# Patient Record
Sex: Male | Born: 1975 | Race: White | Hispanic: No | Marital: Married | Smoking: Former smoker
Health system: Southern US, Community
[De-identification: ages and names within clinical notes are randomized; demographics above are authoritative.]

---

## 2015-01-25 ENCOUNTER — Emergency Department (HOSPITAL_BASED_OUTPATIENT_CLINIC_OR_DEPARTMENT_OTHER)
Admission: EM | Admit: 2015-01-25 | Discharge: 2015-01-26 | Disposition: A | Discharge status: Home / Self Care | Payer: 59 | Attending: Emergency Medicine | Admitting: Emergency Medicine

## 2015-01-25 ENCOUNTER — Emergency Department (HOSPITAL_BASED_OUTPATIENT_CLINIC_OR_DEPARTMENT_OTHER): Payer: 59

## 2015-01-25 ENCOUNTER — Encounter (HOSPITAL_BASED_OUTPATIENT_CLINIC_OR_DEPARTMENT_OTHER): Payer: Self-pay | Admitting: Emergency Medicine

## 2015-01-25 DIAGNOSIS — R002 Palpitations: Secondary | ICD-10-CM | POA: Insufficient documentation

## 2015-01-25 DIAGNOSIS — D649 Anemia, unspecified: Secondary | ICD-10-CM | POA: Diagnosis not present

## 2015-01-25 DIAGNOSIS — R0602 Shortness of breath: Secondary | ICD-10-CM

## 2015-01-25 DIAGNOSIS — Z87891 Personal history of nicotine dependence: Secondary | ICD-10-CM | POA: Insufficient documentation

## 2015-01-25 DIAGNOSIS — R000 Tachycardia, unspecified: Secondary | ICD-10-CM | POA: Diagnosis not present

## 2015-01-25 LAB — CBC WITH DIFFERENTIAL/PLATELET
BASOS PCT: 1 % (ref 0–1)
Basophils Absolute: 0.1 10*3/uL (ref 0.0–0.1)
Eosinophils Absolute: 0.2 10*3/uL (ref 0.0–0.7)
Eosinophils Relative: 2 % (ref 0–5)
HCT: 39.1 % (ref 39.0–52.0)
HEMOGLOBIN: 11.9 g/dL — AB (ref 13.0–17.0)
Lymphocytes Relative: 41 % (ref 12–46)
Lymphs Abs: 4.2 10*3/uL — ABNORMAL HIGH (ref 0.7–4.0)
MCH: 23.1 pg — ABNORMAL LOW (ref 26.0–34.0)
MCHC: 30.4 g/dL (ref 30.0–36.0)
MCV: 75.9 fL — ABNORMAL LOW (ref 78.0–100.0)
MONOS PCT: 7 % (ref 3–12)
Monocytes Absolute: 0.7 10*3/uL (ref 0.1–1.0)
NEUTROS PCT: 49 % (ref 43–77)
Neutro Abs: 5.3 10*3/uL (ref 1.7–7.7)
Platelets: 469 10*3/uL — ABNORMAL HIGH (ref 150–400)
RBC: 5.15 MIL/uL (ref 4.22–5.81)
RDW: 16.1 % — ABNORMAL HIGH (ref 11.5–15.5)
WBC: 10.5 10*3/uL (ref 4.0–10.5)

## 2015-01-25 LAB — BASIC METABOLIC PANEL
Anion gap: 11 (ref 5–15)
BUN: 10 mg/dL (ref 6–20)
CO2: 28 mmol/L (ref 22–32)
Calcium: 9.8 mg/dL (ref 8.9–10.3)
Chloride: 101 mmol/L (ref 101–111)
Creatinine, Ser: 0.93 mg/dL (ref 0.61–1.24)
Glucose, Bld: 105 mg/dL — ABNORMAL HIGH (ref 65–99)
POTASSIUM: 4.1 mmol/L (ref 3.5–5.1)
Sodium: 140 mmol/L (ref 135–145)

## 2015-01-25 LAB — TROPONIN I: Troponin I: <0.03 ng/mL (ref <0.031)

## 2015-01-25 NOTE — ED Notes (Signed)
Patient states that he is having SOB new onset lasting about 10 minutes about 30 minutes ago. Patient states that he was driving and his fit bit showed a HR of 120

## 2015-01-25 NOTE — ED Provider Notes (Signed)
CSN: [id number]     Arrival date & time 01/25/15  1920 History  This chart was scribed for Ricky FossaElizabeth Javanna Patin, MD by Abel PrestoKara Demonbreun, ED Scribe. This patient was seen in room MH02/MH02 and the patient's care was started at 10:44 PM.    Chief Complaint  Patient presents with  . Tachycardia  . Shortness of Breath    The history is provided by the patient. No language interpreter was used.   HPI Comments: Ricky Henderson is a 39 y.o. male who presents to the Emergency Department complaining of SOB with onset around 6:30 PM lasting approximately 0.5 hrs which has resolved. He notes associated palpitations. Pt states HR was 120 bpm. He reports recent stressors and is a caffeine drinker. He denies h/o anxiety. Pt denies significant PMHx. Pt denies recent prolonged travel. Pt reports FHx of HTN. Pt denies fever, chest pain, cough, and leg swelling. Symptoms are moderate, intermittent, resolved.  History reviewed. No pertinent past medical history. History reviewed. No pertinent past surgical history. History reviewed. No pertinent family history. History  Substance Use Topics  . Smoking status: Former Games developermoker  . Smokeless tobacco: Not on file  . Alcohol Use: No    Review of Systems  Constitutional: Negative for fever.  Respiratory: Positive for shortness of breath. Negative for cough.   Cardiovascular: Positive for palpitations. Negative for chest pain and leg swelling.  All other systems reviewed and are negative.    Allergies  Review of patient's allergies indicates no known allergies.  Home Medications   Prior to Admission medications   Not on File   BP 126/74 mmHg  Pulse 96  Temp(Src) 98.5 F (36.9 C) (Oral)  Resp 16  Ht 5\' 10"  (1.778 m)  Wt 165 lb 5.5 oz (75 kg)  BMI 23.72 kg/m2  SpO2 100% Physical Exam  Constitutional: He is oriented to person, place, and time. He appears well-developed and well-nourished.  HENT:  Head: Normocephalic and atraumatic.  Cardiovascular: Normal  rate and regular rhythm.   No murmur heard. Pulmonary/Chest: Effort normal and breath sounds normal. No respiratory distress.  Abdominal: Soft. There is no tenderness. There is no rebound and no guarding.  Genitourinary:  Brown stool, heme-negative  Musculoskeletal: He exhibits no edema or tenderness.  Neurological: He is alert and oriented to person, place, and time.  Skin: Skin is warm and dry.  Psychiatric: He has a normal mood and affect. His behavior is normal.  Nursing note and vitals reviewed.   ED Course  Procedures (including critical care time) DIAGNOSTIC STUDIES: Oxygen Saturation is 100% on room air, normal by my interpretation.    COORDINATION OF CARE: 10:47 PM Discussed treatment plan with patient at beside, the patient agrees with the plan and has no further questions at this time.   Labs Review Labs Reviewed  BASIC METABOLIC PANEL - Abnormal; Notable for the following:    Glucose, Bld 105 (*)    All other components within normal limits  CBC WITH DIFFERENTIAL/PLATELET - Abnormal; Notable for the following:    Hemoglobin 11.9 (*)    MCV 75.9 (*)    MCH 23.1 (*)    RDW 16.1 (*)    Platelets 469 (*)    Lymphs Abs 4.2 (*)    All other components within normal limits  TROPONIN I    Imaging Review Dg Chest 2 View  01/25/2015   CLINICAL DATA:  Shortness of breath tachycardia which began earlier tonight  EXAM: CHEST  2 VIEW  COMPARISON:  None.  FINDINGS: The heart size and vascular pattern are normal. No consolidation effusion or pneumothorax. Mediastinal and hilar contours are normal.  IMPRESSION: Negative chest radiograph   Electronically Signed   By: Esperanza Heir M.D.   On: 01/25/2015 21:58     EKG Interpretation   Date/Time:  Monday Jan 25 2015 19:34:44 EDT Ventricular Rate:  91 PR Interval:  164 QRS Duration: 82 QT Interval:  340 QTC Calculation: 418 R Axis:   44 Text Interpretation:  Normal sinus rhythm Normal ECG Confirmed by Lincoln Brigham [phone])  on 01/25/2015 10:47:37 PM      MDM   Final diagnoses:  SOB (shortness of breath)  Anemia, unspecified anemia type   Patient here for evaluation of shortness of breath and palpitations, symptoms resolved in the emergency department. Presentation are consistent with ACS, PE, CHF, dissection, pneumonia. Patient is noted to be anemic, no history of hematochezia or melena. Patient is a vegetarian. Anemia is microcytic, question iron deficiency. Recommend patient started MVI with iron. Discussed close PCP follow-up as well as return precautions.  I personally performed the services described in this documentation, which was scribed in my presence. The recorded information has been reviewed and is accurate.     Ricky Fossa, MD 01/26/15 [phone]

## 2015-01-26 NOTE — Discharge Instructions (Signed)
You are anemic today (low hemoglobin) - this may be due to lack of iron in your diet.  Start taking a multivitamin with iron, available over the counter.     Anemia, Nonspecific Anemia is a condition in which the concentration of red blood cells or hemoglobin in the blood is below normal. Hemoglobin is a substance in red blood cells that carries oxygen to the tissues of the body. Anemia results in not enough oxygen reaching these tissues.  CAUSES  Common causes of anemia include:   Excessive bleeding. Bleeding may be internal or external. This includes excessive bleeding from periods (in women) or from the intestine.   Poor nutrition.   Chronic kidney, thyroid, and liver disease.  Bone marrow disorders that decrease red blood cell production.  Cancer and treatments for cancer.  HIV, AIDS, and their treatments.  Spleen problems that increase red blood cell destruction.  Blood disorders.  Excess destruction of red blood cells due to infection, medicines, and autoimmune disorders. SIGNS AND SYMPTOMS   Minor weakness.   Dizziness.   Headache.  Palpitations.   Shortness of breath, especially with exercise.   Paleness.  Cold sensitivity.  Indigestion.  Nausea.  Difficulty sleeping.  Difficulty concentrating. Symptoms may occur suddenly or they may develop slowly.  DIAGNOSIS  Additional blood tests are often needed. These help your health care provider determine the best treatment. Your health care provider will check your stool for blood and look for other causes of blood loss.  TREATMENT  Treatment varies depending on the cause of the anemia. Treatment can include:   Supplements of iron, vitamin B12, or folic acid.   Hormone medicines.   A blood transfusion. This may be needed if blood loss is severe.   Hospitalization. This may be needed if there is significant continual blood loss.   Dietary changes.  Spleen removal. HOME CARE  INSTRUCTIONS Keep all follow-up appointments. It often takes many weeks to correct anemia, and having your health care provider check on your condition and your response to treatment is very important. SEEK IMMEDIATE MEDICAL CARE IF:   You develop extreme weakness, shortness of breath, or chest pain.   You become dizzy or have trouble concentrating.  You develop heavy vaginal bleeding.   You develop a rash.   You have bloody or black, tarry stools.   You faint.   You vomit up blood.   You vomit repeatedly.   You have abdominal pain.  You have a fever or persistent symptoms for more than 2-3 days.   You have a fever and your symptoms suddenly get worse.   You are dehydrated.  MAKE SURE YOU:  Understand these instructions.  Will watch your condition.  Will get help right away if you are not doing well or get worse. Document Released: 09/28/2004 Document Revised: 04/23/2013 Document Reviewed: 02/14/2013 [facility] Patient Information 2015 [place], [place]. This information is not intended to replace advice given to you by your health care provider. Make sure you discuss any questions you have with your health care provider.  Shortness of Breath Shortness of breath means you have trouble breathing. It could also mean that you have a medical problem. You should get immediate medical care for shortness of breath. CAUSES   Not enough oxygen in the air such as with high altitudes or a smoke-filled room.  Certain lung diseases, infections, or problems.  Heart disease or conditions, such as angina or heart failure.  Low red blood cells (anemia).  Poor  physical fitness, which can cause shortness of breath when you exercise.  Chest or back injuries or stiffness.  Being overweight.  Smoking.  Anxiety, which can make you feel like you are not getting enough air. DIAGNOSIS  Serious medical problems can often be found during your physical exam. Tests may also be done  to determine why you are having shortness of breath. Tests may include:  Chest X-rays.  Lung function tests.  Blood tests.  An electrocardiogram (ECG).  An ambulatory electrocardiogram. An ambulatory ECG records your heartbeat patterns over a 24-hour period.  Exercise testing.  A transthoracic echocardiogram (TTE). During echocardiography, sound waves are used to evaluate how blood flows through your heart.  A transesophageal echocardiogram (TEE).  Imaging scans. Your health care provider may not be able to find a cause for your shortness of breath after your exam. In this case, it is important to have a follow-up exam with your health care provider as directed.  TREATMENT  Treatment for shortness of breath depends on the cause of your symptoms and can vary greatly. HOME CARE INSTRUCTIONS   Do not smoke. Smoking is a common cause of shortness of breath. If you smoke, ask for help to quit.  Avoid being around chemicals or things that may bother your breathing, such as paint fumes and dust.  Rest as needed. Slowly resume your usual activities.  If medicines were prescribed, take them as directed for the full length of time directed. This includes oxygen and any inhaled medicines.  Keep all follow-up appointments as directed by your health care provider. SEEK MEDICAL CARE IF:   Your condition does not improve in the time expected.  You have a hard time doing your normal activities even with rest.  You have any new symptoms. SEEK IMMEDIATE MEDICAL CARE IF:   Your shortness of breath gets worse.  You feel light-headed, faint, or develop a cough not controlled with medicines.  You start coughing up blood.  You have pain with breathing.  You have chest pain or pain in your arms, shoulders, or abdomen.  You have a fever.  You are unable to walk up stairs or exercise the way you normally do. MAKE SURE YOU:  Understand these instructions.  Will watch your  condition.  Will get help right away if you are not doing well or get worse. Document Released: 05/16/2001 Document Revised: 08/26/2013 Document Reviewed: 11/06/2011 [facility] Patient Information 2015 [place], [place]. This information is not intended to replace advice given to you by your health care provider. Make sure you discuss any questions you have with your health care provider.

## 2016-02-08 ENCOUNTER — Telehealth: Payer: Self-pay | Admitting: Hematology and Oncology

## 2016-02-08 ENCOUNTER — Encounter: Payer: Self-pay | Admitting: Hematology and Oncology

## 2016-02-08 NOTE — Telephone Encounter (Signed)
Referring office Rubin Payor(Edith) contacted us regarding pt being anxious regarding hem appt. Verified insurance and address, referring provider aware of appt. Date/time, mailed new pt packet

## 2016-02-15 ENCOUNTER — Encounter: Payer: Self-pay | Admitting: Hematology and Oncology

## 2016-02-15 ENCOUNTER — Ambulatory Visit (HOSPITAL_BASED_OUTPATIENT_CLINIC_OR_DEPARTMENT_OTHER): Payer: 59 | Admitting: Hematology and Oncology

## 2016-02-15 VITALS — BP 117/72 | HR 80 | Temp 98.0°F | Resp 18 | Ht 70.0 in | Wt 167.1 lb

## 2016-02-15 DIAGNOSIS — D509 Iron deficiency anemia, unspecified: Secondary | ICD-10-CM | POA: Diagnosis not present

## 2016-02-15 NOTE — Assessment & Plan Note (Signed)
Differential diagnosis:  Decreased oral intake of iron being a vegetarian versus malabsorption versus mild blood loss from gastritis/duodenitis. I discussed with the patient the hard absorption pathway and potential causes of iron deficiency from malabsorption  Blood work done by Dr. Loreta AveMann revealed hematocrit of 38, platelets 478, ferritin 16, normal tissue transglutaminase antibody.  Review of previous blood work revealed  09/29/2010: Hemoglobin 14.7 07/08/2011: 13.3 02/16/2011: 12.9 01/13/2014: 11.8  Recommendation: 1. Continue oral iron therapy as prescribed by Dr. Loreta AveMann 2. Recheck hemoglobin and iron studies in 3 months. If his counts have improved then he will remain on oral iron supplement. If they have not improved we will consider intravenous iron therapy.  3. I will also check a hemoglobin electrophoresis when he comes back to see me 3 months.

## 2016-02-15 NOTE — Progress Notes (Signed)
[facility] CONSULT NOTE  Patient Care Team: No Pcp Per Patient as PCP - General (General Practice)  CHIEF COMPLAINTS/PURPOSE OF CONSULTATION:  Iron deficiency anemia  HISTORY OF PRESENTING ILLNESS:  Ricky Henderson 40 y.o. male is here because of recent diagnosis of iron deficiency anemia and was started on oral therapy by Dr. Loreta AveMann. Patient used to live in [place] where he was diagnosed with mild microcytic anemia at least going back to 4-5 years. For the past 4-5 years his hemoglobin has slowly trickled down and most recently his hemoglobin was 11.8. He has not had any symptoms of anemia. He denies any palpitations or shortness of breath or dizziness. He had seen Dr. Loreta AveMann who reviewed his records. In [place], he had upper endoscopy and colonoscopy which revealed gastritis and duodenitis. His colonoscopy was normal.  I reviewed her records extensively and collaborated the history with the patient.   MEDICAL HISTORY: No previous medical illnesses No past medical history on file.  SURGICAL HISTORY: No surgeries No past surgical history on file.  SOCIAL HISTORY: Denies tobacco alcohol or recreational drug use Social History   Social History  . Marital Status: Married    Spouse Name: N/A  . Number of Children: N/A  . Years of Education: N/A   Occupational History  . Not on file.   Social History Main Topics  . Smoking status: Former Games developermoker  . Smokeless tobacco: Not on file  . Alcohol Use: No  . Drug Use: No  . Sexual Activity: Not on file   Other Topics Concern  . Not on file   Social History Narrative  . No narrative on file    FAMILY HISTORY:No family history of anemia or cancers No family history on file.  ALLERGIES:  has No Known Allergies.  MEDICATIONS: Does not take any medications No current outpatient prescriptions on file.   No current facility-administered medications for this visit.    REVIEW OF SYSTEMS:   Constitutional: Denies  fevers, chills or abnormal night sweats Eyes: Denies blurriness of vision, double vision or watery eyes Ears, nose, mouth, throat, and face: Denies mucositis or sore throat Respiratory: Denies cough, dyspnea or wheezes Cardiovascular: Denies palpitation, chest discomfort or lower extremity swelling Gastrointestinal:  Denies nausea, heartburn or change in bowel habits, complains of left flank pain Skin: Denies abnormal skin rashes Lymphatics: Denies new lymphadenopathy or easy bruising Neurological:Denies numbness, tingling or new weaknesses Behavioral/Psych: Mood is stable, no new changes   All other systems were reviewed with the patient and are negative.  PHYSICAL EXAMINATION: ECOG PERFORMANCE STATUS: 1 - Symptomatic but completely ambulatory  Filed Vitals:   02/15/16 1222  BP: 117/72  Pulse: 80  Temp: 98 F (36.7 C)  Resp: 18   Filed Weights   02/15/16 1222  Weight: 167 lb 1.6 oz (75.796 kg)    GENERAL:alert, no distress and comfortable SKIN: skin color, texture, turgor are normal, no rashes or significant lesions EYES: normal, conjunctiva are pink and non-injected, sclera clear OROPHARYNX:no exudate, no erythema and lips, buccal mucosa, and tongue normal  NECK: supple, thyroid normal size, non-tender, without nodularity LYMPH:  no palpable lymphadenopathy in the cervical, axillary or inguinal LUNGS: clear to auscultation and percussion with normal breathing effort HEART: regular rate & rhythm and no murmurs and no lower extremity edema ABDOMEN:abdomen soft, non-tender and normal bowel sounds Musculoskeletal:no cyanosis of digits and no clubbing  PSYCH: alert & oriented x 3 with fluent speech NEURO: no focal motor/sensory deficits  LABORATORY DATA:  I have reviewed the data as listed Lab Results  Component Value Date   WBC 10.5 01/25/2015   HGB 11.9* 01/25/2015   HCT 39.1 01/25/2015   MCV 75.9* 01/25/2015   PLT 469* 01/25/2015   Lab Results  Component Value  Date   NA 140 01/25/2015   K 4.1 01/25/2015   CL 101 01/25/2015   CO2 28 01/25/2015    RADIOGRAPHIC STUDIES: I have personally reviewed the radiological reports and agreed with the findings in the report.  ASSESSMENT AND PLAN:  Iron deficiency anemia Differential diagnosis:  Decreased oral intake of iron being a vegetarian versus malabsorption versus mild blood loss from gastritis/duodenitis. I discussed with the patient the hard absorption pathway and potential causes of iron deficiency from malabsorption  Blood work done by Dr. Loreta Ave revealed hematocrit of 38, platelets 478, ferritin 16, normal tissue transglutaminase antibody.  Review of previous blood work revealed  09/29/2010: Hemoglobin 14.7 07/08/2011: 13.3 02/16/2011: 12.9 01/13/2014: 11.8  Recommendation: 1. Continue oral iron therapy as prescribed by Dr. Loreta Ave 2. Recheck hemoglobin and iron studies in 3 months. If his counts have improved then he will remain on oral iron supplement. If they have not improved we will consider intravenous iron therapy.  3. I will also check a hemoglobin electrophoresis when he comes back to see me 3 months.   Left flank pain: Could be related to kidney stones. Patient plans to go to [place] and get a CT stone protocol over there. Return to clinic in 3 months for follow-up to recheck on blood work and to assess if he is responding to oral iron.  All questions were answered. The patient knows to call the clinic with any problems, questions or concerns.    Sabas Sous, MD 02/15/2016

## 2016-04-01 IMAGING — DX DG CHEST 2V
2 series · 2 of 2 positions shown · non-contrast
Comparison: None.

CLINICAL DATA: Shortness of breath tachycardia which began earlier
tonight

EXAM:
CHEST  2 VIEW

[chest pa]
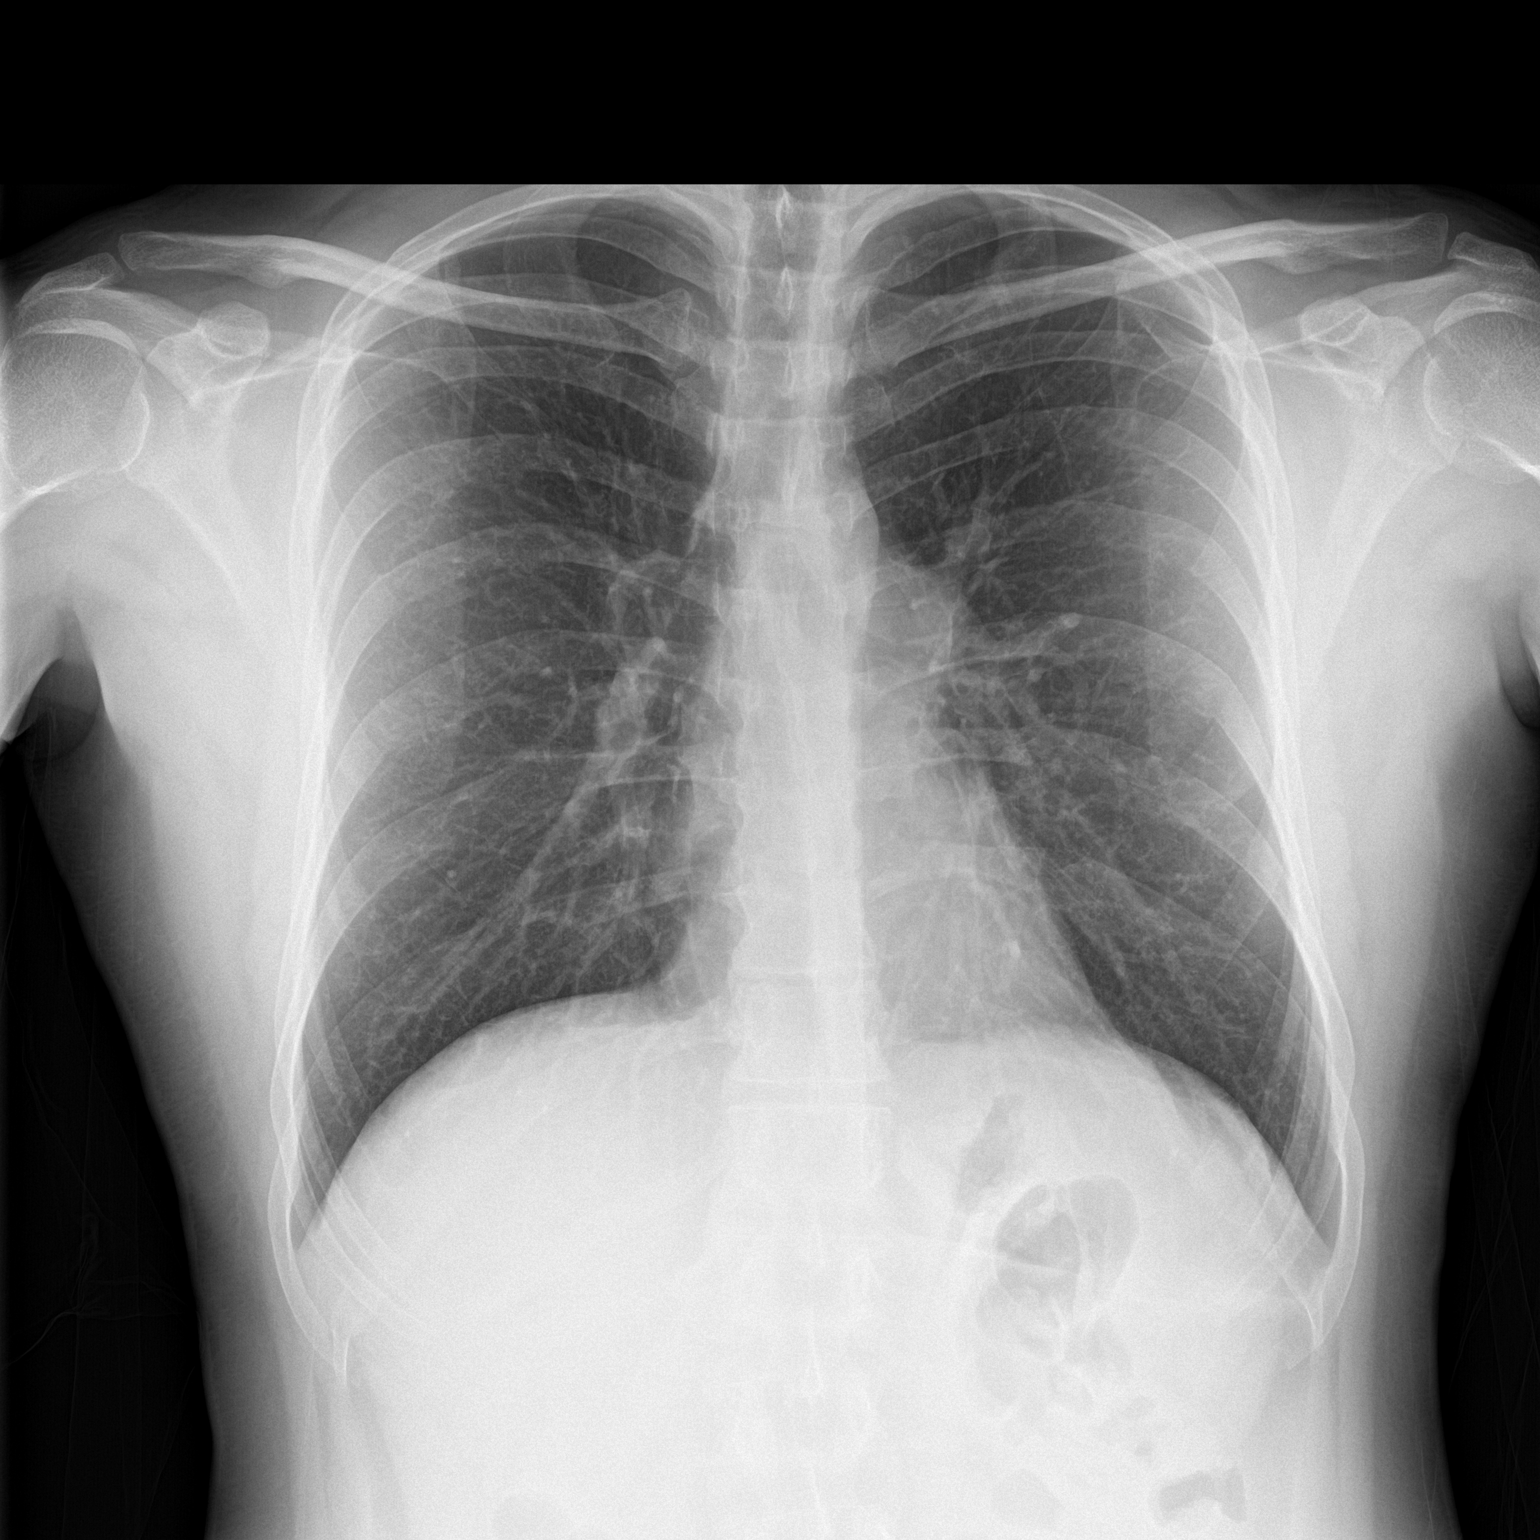

[chest lat]
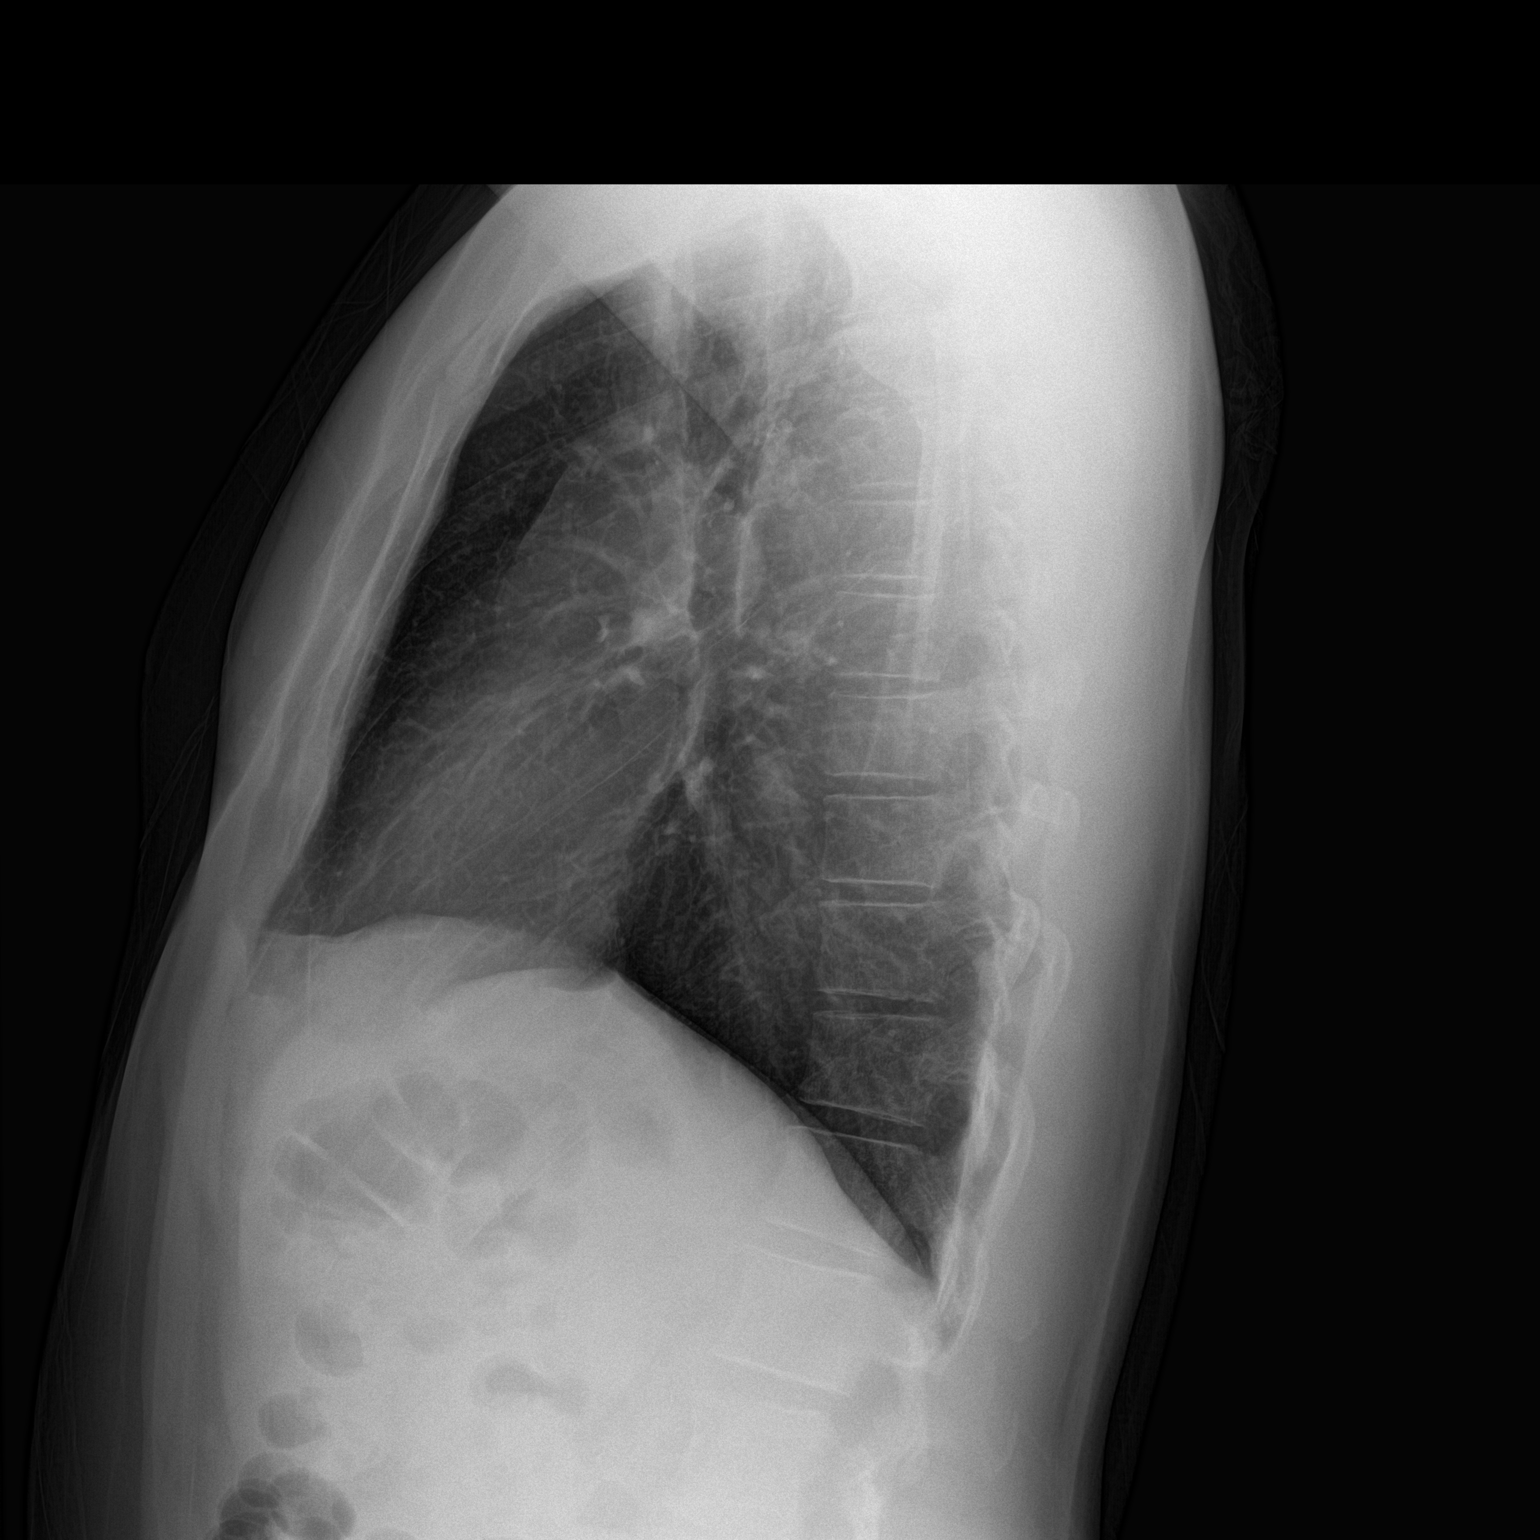

[2 of 2 positions shown; findings below may reference images not displayed]

FINDINGS: The heart size and vascular pattern are normal. No consolidation
effusion or pneumothorax. Mediastinal and hilar contours are normal.
IMPRESSION: Negative chest radiograph

## 2016-05-10 ENCOUNTER — Other Ambulatory Visit: Payer: 59

## 2016-05-15 ENCOUNTER — Telehealth: Payer: Self-pay | Admitting: Hematology and Oncology

## 2016-05-15 NOTE — Telephone Encounter (Signed)
05/17/2016 Appointment canceled per patient request.

## 2016-05-17 ENCOUNTER — Ambulatory Visit: Payer: 59 | Admitting: Hematology and Oncology

## 2021-06-01 ENCOUNTER — Other Ambulatory Visit: Payer: Self-pay

## 2021-06-01 ENCOUNTER — Other Ambulatory Visit: Payer: 59 | Admitting: *Deleted

## 2021-06-01 DIAGNOSIS — Z125 Encounter for screening for malignant neoplasm of prostate: Secondary | ICD-10-CM

## 2021-06-01 NOTE — Progress Notes (Signed)
Patient: Ricky Henderson           Date of Birth: 1975/10/02           MRN: [id number] Visit Date: 06/01/2021 PCP: Patient, No Pcp Per (Inactive)  Prostate Cancer Screening Date of last physical exam:  (June 2022) Date of last rectal exam:  (2014) Have you ever had any of the following?: None Have you ever had or been told you have an allergy to latex products?: No Are you currently taking any natural prostate preparations?: No Are you currently experiencing any urinary symptoms?: No  Prostate Exam Exam not completed. PSA Only.  Patient's History Patient Active Problem List   Diagnosis Date Noted   Iron deficiency anemia 02/15/2016   No past medical history on file.  No family history on file.  Social History   Occupational History   Not on file  Tobacco Use   Smoking status: Former   Smokeless tobacco: Not on file  Substance and Sexual Activity   Alcohol use: No   Drug use: No   Sexual activity: Not on file

## 2021-06-02 LAB — PSA: Prostate Specific Ag, Serum: 0.5 ng/mL (ref 0.0–4.0)

## 2021-06-13 ENCOUNTER — Telehealth: Payer: Self-pay

## 2021-06-13 NOTE — Telephone Encounter (Signed)
Normal PSA letter mailed on 06/08/2021.  June 08, 2021         Dear Mr. Ricky Henderson,   Thank you for your participation in the Prostate Cancer Screening at [facility] on June 01, 2021.   The result of your blood test for the level of the Prostate Specific Antigen (PSA) was found to be within normal range. It is recommended that you continue screening at age 45 every 1-2 years.  If you have any questions about these results, please call Fonnie Mu at [phone] or Menelik Mcfarren at [phone]  Sincerely,       Fonnie Mu, MSN, RN, OCN Oncology Outreach Manager [facility]   Clois Dupes, LPN Oncology Outreach Manager [facility]
